# Patient Record
Sex: Male | Born: 1980 | Race: Black or African American | Hispanic: No | Marital: Single | Smoking: Never smoker
Health system: Southern US, Community
[De-identification: ages and names within clinical notes are randomized; demographics above are authoritative.]

## PROBLEM LIST (undated history)

## (undated) DIAGNOSIS — Z789 Other specified health status: Secondary | ICD-10-CM

---
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
## 2015-08-29 ENCOUNTER — Emergency Department (HOSPITAL_COMMUNITY): Payer: Self-pay

## 2015-08-29 ENCOUNTER — Encounter (HOSPITAL_COMMUNITY): Payer: Self-pay | Admitting: Emergency Medicine

## 2015-08-29 ENCOUNTER — Emergency Department (HOSPITAL_COMMUNITY)
Admission: EM | Admit: 2015-08-29 | Discharge: 2015-08-29 | Disposition: A | Discharge status: Home / Self Care | Payer: Self-pay | Attending: Emergency Medicine | Admitting: Emergency Medicine

## 2015-08-29 DIAGNOSIS — K439 Ventral hernia without obstruction or gangrene: Secondary | ICD-10-CM

## 2015-08-29 DIAGNOSIS — R109 Unspecified abdominal pain: Secondary | ICD-10-CM

## 2015-08-29 DIAGNOSIS — K469 Unspecified abdominal hernia without obstruction or gangrene: Secondary | ICD-10-CM | POA: Insufficient documentation

## 2015-08-29 LAB — COMPREHENSIVE METABOLIC PANEL
ALK PHOS: 94 U/L (ref 38–126)
ALT: 31 U/L (ref 17–63)
ANION GAP: 5 (ref 5–15)
AST: 23 U/L (ref 15–41)
Albumin: 4 g/dL (ref 3.5–5.0)
BILIRUBIN TOTAL: 0.7 mg/dL (ref 0.3–1.2)
BUN: 10 mg/dL (ref 6–20)
CALCIUM: 9.5 mg/dL (ref 8.9–10.3)
CO2: 30 mmol/L (ref 22–32)
Chloride: 105 mmol/L (ref 101–111)
Creatinine, Ser: 1.28 mg/dL — ABNORMAL HIGH (ref 0.61–1.24)
GFR calc Af Amer: >60 mL/min (ref >60)
Glucose, Bld: 127 mg/dL — ABNORMAL HIGH (ref 65–99)
POTASSIUM: 3.8 mmol/L (ref 3.5–5.1)
Sodium: 140 mmol/L (ref 135–145)
TOTAL PROTEIN: 7.7 g/dL (ref 6.5–8.1)

## 2015-08-29 LAB — CBC
HEMATOCRIT: 45 % (ref 39.0–52.0)
HEMOGLOBIN: 15.5 g/dL (ref 13.0–17.0)
MCH: 31.3 pg (ref 26.0–34.0)
MCHC: 34.4 g/dL (ref 30.0–36.0)
MCV: 90.7 fL (ref 78.0–100.0)
Platelets: 257 10*3/uL (ref 150–400)
RBC: 4.96 MIL/uL (ref 4.22–5.81)
RDW: 11.9 % (ref 11.5–15.5)
WBC: 7.2 10*3/uL (ref 4.0–10.5)

## 2015-08-29 LAB — URINALYSIS, ROUTINE W REFLEX MICROSCOPIC
BILIRUBIN URINE: NEGATIVE
Glucose, UA: NEGATIVE mg/dL
HGB URINE DIPSTICK: NEGATIVE
KETONES UR: NEGATIVE mg/dL
LEUKOCYTES UA: NEGATIVE
NITRITE: NEGATIVE
PH: 7 (ref 5.0–8.0)
Protein, ur: NEGATIVE mg/dL
Specific Gravity, Urine: 1.021 (ref 1.005–1.030)

## 2015-08-29 LAB — SAMPLE TO BLOOD BANK

## 2015-08-29 LAB — I-STAT CG4 LACTIC ACID, ED: Lactic Acid, Venous: 0.92 mmol/L (ref 0.5–2.0)

## 2015-08-29 LAB — LIPASE, BLOOD: Lipase: 47 U/L (ref 11–51)

## 2015-08-29 MED ORDER — HYDROCODONE-ACETAMINOPHEN 5-325 MG PO TABS: 2.0000 | ORAL_TABLET | ORAL | Status: DC | PRN

## 2015-08-29 MED ORDER — OXYCODONE-ACETAMINOPHEN 5-325 MG PO TABS: 2.0000 | ORAL_TABLET | ORAL | Status: DC | PRN

## 2015-08-29 MED ORDER — OXYCODONE-ACETAMINOPHEN 5-325 MG PO TABS
1.0000 | ORAL_TABLET | Freq: Once | ORAL | Status: AC
  Administered 2015-08-29: 1 via ORAL

## 2015-08-29 MED ORDER — IOHEXOL 300 MG/ML  SOLN
100.0000 mL | Freq: Once | INTRAMUSCULAR | Status: AC | PRN
  Administered 2015-08-29: 50 mL via INTRAVENOUS

## 2015-08-29 MED ORDER — OXYCODONE-ACETAMINOPHEN 5-325 MG PO TABS
ORAL_TABLET | ORAL | Status: AC
  Filled 2015-08-29: qty 1

## 2015-08-29 NOTE — Discharge Instructions (Signed)
Mr. Durenda GuthrieWilder,  Nice meeting you! Please follow-up with surgery (information attached). Return to the emergency department if you develop fevers, are unable to use the bathroom, or have increased pain. Your pain medication could constipate you, so please take a stool softener in addition to the pain medication. Feel better soon!  S. Lane HackerNicole Kadien Lineman, PA-C

## 2015-08-29 NOTE — ED Notes (Signed)
Pt tolerating PO intake

## 2015-08-29 NOTE — ED Provider Notes (Signed)
CSN: [id number]     Arrival date & time 08/29/15  1305 History   First MD Initiated Contact with Patient 08/29/15 1525     Chief Complaint  Patient presents with  . Hernia    HPI   Alexander Woodward is a 34 y.o. M PMH significant for HTN presenting with painful hernia since today. He states he was lifting something heavy in a grocery store when he developed abdominal pain. He describes his pain as 8/10 pain scale, non-radiating, constant, sharp. No fevers, chills, N/V, change in bowel/bladder habits.   History reviewed. No pertinent past medical history. History reviewed. No pertinent past surgical history. No family history on file. Social History  Substance Use Topics  . Smoking status: Never Smoker   . Smokeless tobacco: None  . Alcohol Use: No    Review of Systems  Ten systems are reviewed and are negative for acute change except as noted in the HPI   Allergies  Review of patient's allergies indicates no known allergies.  Home Medications   Prior to Admission medications   Medication Sig Start Date End Date Taking? Authorizing Provider  HYDROcodone-acetaminophen (NORCO/VICODIN) 5-325 MG tablet Take 2 tablets by mouth every 4 (four) hours as needed. 08/29/15   Melton Krebs, PA-C   BP 144/98 mmHg  Pulse 77  Temp(Src) 98 F (36.7 C) (Oral)  Resp 16  Ht  (1.676 m)  Wt 85.73 kg  BMI 30.52 kg/m2  SpO2 99% Physical Exam  Constitutional: He appears well-developed and well-nourished. No distress.  HENT:  Head: Normocephalic and atraumatic.  Mouth/Throat: Oropharynx is clear and moist. No oropharyngeal exudate.  Eyes: Conjunctivae are normal. Pupils are equal, round, and reactive to light. Right eye exhibits no discharge. Left eye exhibits no discharge. No scleral icterus.  Neck: No tracheal deviation present.  Cardiovascular: Normal rate, regular rhythm, normal heart sounds and intact distal pulses.  Exam reveals no gallop and no friction rub.   No murmur  heard. Pulmonary/Chest: Effort normal and breath sounds normal. No respiratory distress. He has no wheezes. He has no rales. He exhibits no tenderness.  Abdominal: Soft. Bowel sounds are normal. He exhibits no distension and no mass. There is tenderness. There is no rebound and no guarding.  Supraumbilical non-reducible hernia with associated tenderness. No erythema, drainage.   Musculoskeletal: He exhibits no edema.  Lymphadenopathy:    He has no cervical adenopathy.  Neurological: He is alert. Coordination normal.  Skin: Skin is warm and dry. No rash noted. He is not diaphoretic. No erythema.  Psychiatric: He has a normal mood and affect. His behavior is normal.  Nursing note and vitals reviewed.   ED Course  Procedures  Labs Review Labs Reviewed  COMPREHENSIVE METABOLIC PANEL - Abnormal; Notable for the following:    Glucose, Bld 127 (*)    Creatinine, Ser 1.28 (*)    All other components within normal limits  URINALYSIS, ROUTINE W REFLEX MICROSCOPIC (NOT AT [facility]) - Abnormal; Notable for the following:    APPearance CLOUDY (*)    All other components within normal limits  LIPASE, BLOOD  CBC  I-STAT CG4 LACTIC ACID, ED  SAMPLE TO BLOOD BANK    Imaging Review Ct Abdomen Pelvis W Contrast  08/29/2015  CLINICAL DATA:  Mid abdominal pain.  Right-sided pain. EXAM: CT ABDOMEN AND PELVIS WITH CONTRAST TECHNIQUE: Multidetector CT imaging of the abdomen and pelvis was performed using the standard protocol following bolus administration of intravenous contrast. CONTRAST:  50mL OMNIPAQUE  IOHEXOL 300 MG/ML  SOLN COMPARISON:  None. FINDINGS: Lower chest: There is no focal lung opacity. No pleural effusion identified. Hepatobiliary: No suspicious liver abnormalities identified. Stones noted within the gallbladder. This measures up to 1.3 cm. There is no biliary dilatation. Pancreas: Negative Spleen: Negative Adrenals/Urinary Tract: The adrenal glands are negative. Normal appearance of the left  kidney. Scar involves the upper pole of right kidney. Urinary bladder is normal. Stomach/Bowel: The stomach appears normal. The small bowel loops have a normal caliber. There is no pathologic dilatation of the small or large bowel loops. The appendix is visualized and appears normal. Vascular/Lymphatic: Normal appearance of the abdominal aorta. No enlarged retroperitoneal or mesenteric adenopathy. No enlarged pelvic or inguinal lymph nodes. Reproductive: The prostate gland and seminal vesicles appear within normal limits. Other: There is no ascites or focal fluid collections within the abdomen or pelvis. There is a supraumbilical hernia which contains fat and a nonobstructed loop of small bowel. There is mild stranding is around the hernia, image 44 of series 2. There is a small calcific density associated with the hernia measure 6 mm, image 45 of series 2. There is also a small periumbilical hernia which contains fat only. Musculoskeletal: No aggressive lytic or sclerotic bone lesions identified. IMPRESSION: 1. There is a supraumbilical ventral abdominal wall hernia containing fat and a nonobstructed loop of small bowel. There is also a 6 mm calcified thick density within this area which is of on certain significance. There is subtle stranding of the surrounding fat. In a patient who is acutely symptomatic in this area early incarceration cannot be excluded. Careful clinical correlation is recommended. 2. Small fat containing umbilical hernia. 3. Gallstones. Electronically Signed   By: Signa Kellaylor  Stroud M.D.   On: 08/29/2015 18:06   Dg Abd Acute W/chest  08/29/2015  CLINICAL DATA:  Chronic abdominal pain EXAM: DG ABDOMEN ACUTE W/ 1V CHEST COMPARISON:  None. FINDINGS: Normal heart size and vascularity. Lungs remain clear. No focal pneumonia, collapse or consolidation. No edema, effusion or pneumothorax. Trachea midline. No free air evident. Scattered air and stool throughout the bowel. No significant dilatation,  obstruction or ileus pattern. No abnormal calcifications or osseous abnormality. IMPRESSION: No acute finding by plain radiography. Electronically Signed   By: Judie PetitM.  Shick M.D.   On: 08/29/2015 17:29   I have personally reviewed and evaluated these images and lab results as part of my medical decision-making.  MDM   Final diagnoses:  Abdominal pain  Hernia of abdominal wall   Patient non-toxic appearing and VSS. Attempted reduction s/p Trendelenburg and ice without success. Although this appears to be a chronic issue, with sudden onset of abdominal pain, this is concerning for strangulation, so will CT.  Labs unremarkable except for slightly elevated creatinine. CT demonstrates supraumbilical ventral abdominal wall hernia containing fat and a nonobstructed loop of small bowel, and possible incarceration in a symptomatic patient.  Dr. Cyndie ChimeNguyen saw patient as well.  She will consult general surgery. General surgery advised outpatient follow-up.  Patient may be safely discharged home. Discussed reasons for return. Patient to follow-up with surgery. Patient in understanding and agreement with the plan.   Melton KrebsSamantha Nicole Sheneika Walstad, PA-C 09/13/15 1405  Leta BaptistEmily Roe Nguyen, MD 09/14/15 [phone]

## 2015-08-29 NOTE — ED Notes (Signed)
Pt states he has had a hernia in his middle abdomen for a few years and this morning while lifting something in the grocery store he began to have pain in the area where the hernia is. Pt states 8/10 abd pain at this time. Denies any n/v/d. Normal bowel movement this AM.

## 2016-06-05 NOTE — Patient Instructions (Addendum)
Alexander Woodward  06/05/2016   Your procedure is scheduled on: 06/14/15  Report to [facility]  [place] to 3rd floor to  Short Stay Center at 0945 AM.  Call this number if you have problems the morning of surgery [phone]   Remember: ONLY 1 PERSON MAY GO WITH YOU TO SHORT STAY TO GET  READY MORNING OF YOUR SURGERY.  Do not eat food or drink liquids :After Midnight.     Take these medicines the morning of surgery with A SIP OF WATER: NONE DO NOT TAKE ANY DIABETIC MEDICATIONS DAY OF YOUR SURGERY                               You may not have any metal on your body including hair pins and              piercings  Do not wear jewelry, make-up, lotions, powders or perfumes, deodorant             Do not wear nail polish.  Do not shave  48 hours prior to surgery.              Men may shave face and neck.   Do not bring valuables to the hospital. [facility] IS NOT             RESPONSIBLE   FOR VALUABLES.  Contacts, dentures or bridgework may not be worn into surgery.  Leave suitcase in the car. After surgery it may be brought to your room.   FAMILY WILL DRIVE HIM HOME MORNING AFTER SURGERY_____________________________________________________________________             [facility] - Preparing for [facility] Before surgery, you can play an important role.  Because skin is not sterile, your skin needs to be as free of germs as possible.  You can reduce the number of germs on your skin by washing with CHG (chlorahexidine gluconate) soap before surgery.  CHG is an antiseptic cleaner which kills germs and bonds with the skin to continue killing germs even after washing. Please DO NOT use if you have an allergy to CHG or antibacterial soaps.  If your skin becomes reddened/irritated stop using the CHG and inform your nurse when you arrive at Short Stay. Do not shave (including legs and underarms) for at least 48 hours prior to the first CHG shower.   You may shave your face/neck. Please follow these instructions carefully:  1.  Shower with CHG Soap the night before surgery and the  morning of Surgery.  2.  If you choose to wash your hair, wash your hair first as usual with your  normal  shampoo.  3.  After you shampoo, rinse your hair and body thoroughly to remove the  shampoo.                           4.  Use CHG as you would any other liquid soap.  You can apply chg directly  to the skin and wash                       Gently with a scrungie or clean washcloth.  5.  Apply the CHG Soap to your body ONLY FROM THE NECK DOWN.  Do not use on face/ open                           Wound or open sores. Avoid contact with eyes, ears mouth and genitals (private parts).                       Wash face,  Genitals (private parts) with your normal soap.             6.  Wash thoroughly, paying special attention to the area where your surgery  will be performed.  7.  Thoroughly rinse your body with warm water from the neck down.  8.  DO NOT shower/wash with your normal soap after using and rinsing off  the CHG Soap.                9.  Pat yourself dry with a clean towel.            10.  Wear clean pajamas.            11.  Place clean sheets on your bed the night of your first shower and do not  sleep with pets. Day of Surgery : Do not apply any lotions/deodorants the morning of surgery.  Please wear clean clothes to the hospital/surgery center.  FAILURE TO FOLLOW THESE INSTRUCTIONS MAY RESULT IN THE CANCELLATION OF YOUR SURGERY PATIENT SIGNATURE_________________________________  NURSE SIGNATURE__________________________________  ________________________________________________________________________

## 2016-06-05 NOTE — Progress Notes (Signed)
Ct chest 11/16 epic

## 2016-06-07 ENCOUNTER — Encounter (HOSPITAL_COMMUNITY)
Admission: RE | Admit: 2016-06-07 | Discharge: 2016-06-07 | Disposition: A | Discharge status: Home / Self Care | Payer: Self-pay | Source: Ambulatory Visit | Attending: General Surgery | Admitting: General Surgery

## 2016-06-07 ENCOUNTER — Encounter (HOSPITAL_COMMUNITY): Payer: Self-pay

## 2016-06-07 DIAGNOSIS — Z01812 Encounter for preprocedural laboratory examination: Secondary | ICD-10-CM | POA: Insufficient documentation

## 2016-06-07 DIAGNOSIS — Z789 Other specified health status: Secondary | ICD-10-CM

## 2016-06-07 DIAGNOSIS — Z01810 Encounter for preprocedural cardiovascular examination: Secondary | ICD-10-CM | POA: Insufficient documentation

## 2016-06-07 HISTORY — DX: Other specified health status: Z78.9

## 2016-06-07 LAB — CBC
HEMATOCRIT: 44.2 % (ref 39.0–52.0)
HEMOGLOBIN: 15.2 g/dL (ref 13.0–17.0)
MCH: 30.9 pg (ref 26.0–34.0)
MCHC: 34.4 g/dL (ref 30.0–36.0)
MCV: 89.8 fL (ref 78.0–100.0)
Platelets: 267 10*3/uL (ref 150–400)
RBC: 4.92 MIL/uL (ref 4.22–5.81)
RDW: 11.8 % (ref 11.5–15.5)
WBC: 5.1 10*3/uL (ref 4.0–10.5)

## 2016-06-07 NOTE — Progress Notes (Signed)
Please place surgical orders in epic. Thanks.  

## 2016-06-07 NOTE — Progress Notes (Signed)
PST NOTE-- pt states had surgery on his heart as a baby- he thinks.  Confirmed this with his mother via phone- she states he was dying and he went by [place] to [place] where the put a catheter around his heart.  At this visit, patint denied any medical problems and does not have a PCP.  He denies any headache, weakness, but BP remained elevated.  I instructed him that he needs to go to a Urgent Care today to be seen by MD and have this evaluated. Stated didn't want him to have a stroke, nor his surgery be cancelled because his BP is high.  Did obtain hospital name from mother, had consent signed for record request, and faxed request to obtain at least thype of surgery he had as a infant

## 2016-06-11 NOTE — Progress Notes (Signed)
Dr Jarrett SohoWilson---NEED PRE OP ORDERS PLEASE

## 2016-06-11 NOTE — Progress Notes (Signed)
Attempted to [facility] on Friday-  Fax [phone] without a confirmation received. Requesting discharge summary from his care as a infant.  Faxed again at 0745 this am with no success.  I Called to verify fax number and was told that records that old have been destroyed!

## 2016-06-12 ENCOUNTER — Ambulatory Visit: Payer: Self-pay | Admitting: General Surgery

## 2016-06-12 NOTE — H&P (Signed)
Alexander Woodward is an 35 y.o. male.   Chief Complaint: here for surgery HPI: The patient is a 35 year old male who presents with an abdominal wall hernia. He is referred by Delana Meyer, PA-C for evaluation of abdominal wall hernia. He states that it has been present for years. However it is gotten bigger lately. This now causing some more discomfort. At times it'll be hard. He does have some intermittent nausea but no vomiting. He denies any diarrhea or constipation. He denies any prior abdominal surgery. He had a right inguinal hernia repair as an infant he says. He does not smoking. He had a CT scan in November 2016 which showed supraumbilical hernia containing fat and a nonobstructed loop of small bowel. He also had a very small umbilical hernia. On CT his hernia measures 3.7 cm wide by approximate 3.5 cm vertical. There is intact bridge of fascia measuring about 1-1/2 cm between the hernia and the umbilicus. The fascial defect of the umbilicus is very small perhaps 4 mm  He denies any changes since he was seen in the office. It did bother him the other day and he took some tylenol.  Past Medical History:  Diagnosis Date  . Medical history non-contributory 06/07/2016   denies    Past Surgical History:  Procedure Laterality Date  . CARDIAC SURGERY  1980   as a newborn    No family history on file. Social History:  reports that he has never smoked. He has never used smokeless tobacco. He reports that he does not drink alcohol or use drugs.  Allergies: No Known Allergies   (Not in a hospital admission)  No results found for this or any previous visit (from the past 48 hour(s)). No results found.  ROS 12 point ROS performed and all negative except for what is mentioned in HPI  There were no vitals taken for this visit.  There were no vitals taken for this visit. Physical Exam  General Mental Status-Alert. General Appearance-Consistent with stated  age. Hydration-Well hydrated. Voice-Normal.  Head and Neck Head-normocephalic, atraumatic with no lesions or palpable masses. Trachea-midline. Thyroid Gland Characteristics - normal size and consistency.  Eye Eyeball - Bilateral-Extraocular movements intact. Sclera/Conjunctiva - Bilateral-No scleral icterus.  Chest and Lung Exam Chest and lung exam reveals -quiet, even and easy respiratory effort with no use of accessory muscles and on auscultation, normal breath sounds, no adventitious sounds and normal vocal resonance. Inspection Chest Wall - Normal. Back - normal.  Breast - Did not examine.  Cardiovascular Cardiovascular examination reveals -normal heart sounds, regular rate and rhythm with no murmurs and normal pedal pulses bilaterally.  Abdomen Inspection  Inspection of the abdomen reveals: Note: Obvious supraumbilical bulge. It is nonreducible. It is nontender. Skin intact. Bulges the size of a tennis ball. Small tiny fascial umbilical defect at the umbilicus less than half a centimeter. Skin - Scar - no surgical scars. Palpation/Percussion Palpation and Percussion of the abdomen reveal - Soft, Non Tender, No Rebound tenderness, No Rigidity (guarding) and No hepatosplenomegaly. Auscultation Auscultation of the abdomen reveals - Bowel sounds normal.  Peripheral Vascular Upper Extremity Palpation - Pulses bilaterally normal.  Neurologic Neurologic evaluation reveals -alert and oriented x 3 with no impairment of recent or remote memory. Mental Status-Normal.  Neuropsychiatric The patient's mood and affect are described as -normal. Judgment and Insight-insight is appropriate concerning matters relevant to self.  Musculoskeletal Normal Exam - Left-Upper Extremity Strength Normal and Lower Extremity Strength Normal. Normal Exam - Right-Upper  Extremity Strength Normal and Lower Extremity Strength Normal.  Lymphatic Head & Neck  General  Head & Neck Lymphatics: Bilateral - Description - Normal. Axillary - Did not examine. Femoral & Inguinal - Did not examine. Assessment/Plan Incarcerated ventral hernia  To OR for lap assisted repair with mesh No new questions.  All questions asked and answered.  Discussed postop pain regimen  Mary SellaEric M. Andrey CampanileWilson, MD, FACS General, Bariatric, & Minimally Invasive Surgery [facility], [place]   Atilano InaWILSON,Kamryn Gauthier M, MD 06/13/2016, 10:48 AM

## 2016-06-12 NOTE — Progress Notes (Signed)
Called [facility] triage desk about obtaining orders for surgery scheduled for 06/13/16

## 2016-06-13 ENCOUNTER — Observation Stay (HOSPITAL_COMMUNITY)
Admission: RE | Admit: 2016-06-13 | Discharge: 2016-06-14 | Disposition: A | Discharge status: Home / Self Care | Payer: Self-pay | Source: Ambulatory Visit | Attending: General Surgery | Admitting: General Surgery

## 2016-06-13 ENCOUNTER — Encounter (HOSPITAL_COMMUNITY)
Admission: RE | Disposition: A | Discharge status: Home / Self Care | Payer: Self-pay | Source: Ambulatory Visit | Attending: General Surgery

## 2016-06-13 ENCOUNTER — Encounter (HOSPITAL_COMMUNITY): Payer: Self-pay

## 2016-06-13 ENCOUNTER — Ambulatory Visit (HOSPITAL_COMMUNITY): Payer: Self-pay | Admitting: Anesthesiology

## 2016-06-13 DIAGNOSIS — K429 Umbilical hernia without obstruction or gangrene: Secondary | ICD-10-CM | POA: Insufficient documentation

## 2016-06-13 DIAGNOSIS — Z9889 Other specified postprocedural states: Secondary | ICD-10-CM

## 2016-06-13 DIAGNOSIS — Z8719 Personal history of other diseases of the digestive system: Secondary | ICD-10-CM

## 2016-06-13 DIAGNOSIS — K439 Ventral hernia without obstruction or gangrene: Principal | ICD-10-CM | POA: Insufficient documentation

## 2016-06-13 HISTORY — PX: INSERTION OF MESH: SHX5868

## 2016-06-13 HISTORY — PX: LAPAROSCOPIC ASSISTED VENTRAL HERNIA REPAIR: SHX6312

## 2016-06-13 SURGERY — REPAIR, HERNIA, VENTRAL, LAPAROSCOPY-ASSISTED
Anesthesia: General | Site: Abdomen

## 2016-06-13 MED ORDER — MORPHINE SULFATE (PF) 2 MG/ML IV SOLN
1.0000 mg | INTRAVENOUS | Status: DC | PRN
  Filled 2016-06-13: qty 1

## 2016-06-13 MED ORDER — ENOXAPARIN SODIUM 40 MG/0.4ML ~~LOC~~ SOLN
40.0000 mg | SUBCUTANEOUS | Status: DC
  Administered 2016-06-14: 40 mg via SUBCUTANEOUS
  Filled 2016-06-13: qty 0.4

## 2016-06-13 MED ORDER — LACTATED RINGERS IV SOLN
INTRAVENOUS | Status: DC
  Administered 2016-06-13 (×2): via INTRAVENOUS

## 2016-06-13 MED ORDER — MIDAZOLAM HCL 2 MG/2ML IJ SOLN
INTRAMUSCULAR | Status: AC
  Filled 2016-06-13: qty 2

## 2016-06-13 MED ORDER — ACETAMINOPHEN 500 MG PO TABS
1000.0000 mg | ORAL_TABLET | Freq: Four times a day (QID) | ORAL | Status: DC
  Administered 2016-06-14 (×2): 1000 mg via ORAL
  Filled 2016-06-13 (×3): qty 2

## 2016-06-13 MED ORDER — METHOCARBAMOL 500 MG PO TABS: 500.0000 mg | ORAL_TABLET | Freq: Four times a day (QID) | ORAL | Status: DC | PRN

## 2016-06-13 MED ORDER — DIPHENHYDRAMINE HCL 12.5 MG/5ML PO ELIX: 12.5000 mg | ORAL_SOLUTION | Freq: Four times a day (QID) | ORAL | Status: DC | PRN

## 2016-06-13 MED ORDER — ONDANSETRON HCL 4 MG/2ML IJ SOLN
INTRAMUSCULAR | Status: DC | PRN
  Administered 2016-06-13: 4 mg via INTRAVENOUS

## 2016-06-13 MED ORDER — CHLORHEXIDINE GLUCONATE CLOTH 2 % EX PADS: 6.0000 | MEDICATED_PAD | Freq: Once | CUTANEOUS | Status: DC

## 2016-06-13 MED ORDER — SODIUM CHLORIDE 0.9 % IJ SOLN
INTRAMUSCULAR | Status: AC
  Filled 2016-06-13: qty 50

## 2016-06-13 MED ORDER — SUGAMMADEX SODIUM 200 MG/2ML IV SOLN
INTRAVENOUS | Status: DC | PRN
  Administered 2016-06-13: 175 mg via INTRAVENOUS

## 2016-06-13 MED ORDER — FENTANYL CITRATE (PF) 100 MCG/2ML IJ SOLN
INTRAMUSCULAR | Status: AC
Start: 2016-06-13 — End: 2016-06-13
  Filled 2016-06-13: qty 2

## 2016-06-13 MED ORDER — PROPOFOL 10 MG/ML IV BOLUS
INTRAVENOUS | Status: DC | PRN
  Administered 2016-06-13: 180 mg via INTRAVENOUS

## 2016-06-13 MED ORDER — KCL IN DEXTROSE-NACL 20-5-0.45 MEQ/L-%-% IV SOLN
INTRAVENOUS | Status: DC
  Administered 2016-06-13: 18:00:00 via INTRAVENOUS
  Filled 2016-06-13 (×2): qty 1000

## 2016-06-13 MED ORDER — ACETAMINOPHEN 500 MG PO TABS
1000.0000 mg | ORAL_TABLET | ORAL | Status: AC
  Administered 2016-06-13: 1000 mg via ORAL
  Filled 2016-06-13: qty 2

## 2016-06-13 MED ORDER — CEFAZOLIN SODIUM-DEXTROSE 2-4 GM/100ML-% IV SOLN
INTRAVENOUS | Status: AC
  Filled 2016-06-13: qty 100

## 2016-06-13 MED ORDER — FENTANYL CITRATE (PF) 100 MCG/2ML IJ SOLN
INTRAMUSCULAR | Status: AC
  Filled 2016-06-13: qty 2

## 2016-06-13 MED ORDER — ONDANSETRON HCL 4 MG/2ML IJ SOLN
4.0000 mg | Freq: Four times a day (QID) | INTRAMUSCULAR | Status: DC | PRN
  Administered 2016-06-13: 4 mg via INTRAVENOUS
  Filled 2016-06-13: qty 2

## 2016-06-13 MED ORDER — ROCURONIUM BROMIDE 10 MG/ML (PF) SYRINGE
PREFILLED_SYRINGE | INTRAVENOUS | Status: DC | PRN
  Administered 2016-06-13 (×3): 5 mg via INTRAVENOUS
  Administered 2016-06-13: 50 mg via INTRAVENOUS

## 2016-06-13 MED ORDER — SUGAMMADEX SODIUM 200 MG/2ML IV SOLN
INTRAVENOUS | Status: AC
  Filled 2016-06-13: qty 2

## 2016-06-13 MED ORDER — CEFAZOLIN SODIUM-DEXTROSE 2-4 GM/100ML-% IV SOLN
2.0000 g | INTRAVENOUS | Status: AC
  Administered 2016-06-13: 2 g via INTRAVENOUS

## 2016-06-13 MED ORDER — MIDAZOLAM HCL 5 MG/5ML IJ SOLN
INTRAMUSCULAR | Status: DC | PRN
  Administered 2016-06-13: 2 mg via INTRAVENOUS

## 2016-06-13 MED ORDER — PROMETHAZINE HCL 25 MG/ML IJ SOLN: 6.2500 mg | INTRAMUSCULAR | Status: DC | PRN

## 2016-06-13 MED ORDER — KETOROLAC TROMETHAMINE 30 MG/ML IJ SOLN
INTRAMUSCULAR | Status: AC
  Filled 2016-06-13: qty 1

## 2016-06-13 MED ORDER — DIPHENHYDRAMINE HCL 50 MG/ML IJ SOLN: 12.5000 mg | Freq: Four times a day (QID) | INTRAMUSCULAR | Status: DC | PRN

## 2016-06-13 MED ORDER — HYDROMORPHONE HCL 1 MG/ML IJ SOLN
INTRAMUSCULAR | Status: DC | PRN
  Administered 2016-06-13 (×2): 1 mg via INTRAVENOUS

## 2016-06-13 MED ORDER — HYDROMORPHONE HCL 2 MG/ML IJ SOLN
INTRAMUSCULAR | Status: AC
  Filled 2016-06-13: qty 1

## 2016-06-13 MED ORDER — 0.9 % SODIUM CHLORIDE (POUR BTL) OPTIME
TOPICAL | Status: DC | PRN
  Administered 2016-06-13: 1000 mL

## 2016-06-13 MED ORDER — FAMOTIDINE IN NACL 20-0.9 MG/50ML-% IV SOLN
20.0000 mg | Freq: Two times a day (BID) | INTRAVENOUS | Status: DC
  Administered 2016-06-13: 20 mg via INTRAVENOUS
  Filled 2016-06-13: qty 50

## 2016-06-13 MED ORDER — LIDOCAINE 2% (20 MG/ML) 5 ML SYRINGE
INTRAMUSCULAR | Status: DC | PRN
  Administered 2016-06-13: 100 mg via INTRAVENOUS

## 2016-06-13 MED ORDER — KETOROLAC TROMETHAMINE 30 MG/ML IJ SOLN
INTRAMUSCULAR | Status: DC | PRN
  Administered 2016-06-13: 30 mg via INTRAVENOUS

## 2016-06-13 MED ORDER — DOCUSATE SODIUM 100 MG PO CAPS
100.0000 mg | ORAL_CAPSULE | Freq: Two times a day (BID) | ORAL | Status: DC
  Administered 2016-06-13: 100 mg via ORAL
  Filled 2016-06-13: qty 1

## 2016-06-13 MED ORDER — DEXAMETHASONE SODIUM PHOSPHATE 10 MG/ML IJ SOLN
INTRAMUSCULAR | Status: DC | PRN
  Administered 2016-06-13: 10 mg via INTRAVENOUS

## 2016-06-13 MED ORDER — PROPOFOL 10 MG/ML IV BOLUS
INTRAVENOUS | Status: AC
  Filled 2016-06-13: qty 20

## 2016-06-13 MED ORDER — BUPIVACAINE LIPOSOME 1.3 % IJ SUSP
20.0000 mL | Freq: Once | INTRAMUSCULAR | Status: DC
  Filled 2016-06-13: qty 20

## 2016-06-13 MED ORDER — FENTANYL CITRATE (PF) 100 MCG/2ML IJ SOLN
INTRAMUSCULAR | Status: DC | PRN
  Administered 2016-06-13: 50 ug via INTRAVENOUS
  Administered 2016-06-13: 100 ug via INTRAVENOUS

## 2016-06-13 MED ORDER — OXYCODONE HCL 5 MG PO TABS: 5.0000 mg | ORAL_TABLET | ORAL | Status: DC | PRN

## 2016-06-13 MED ORDER — HYDROMORPHONE HCL 1 MG/ML IJ SOLN: 0.2500 mg | INTRAMUSCULAR | Status: DC | PRN

## 2016-06-13 MED ORDER — ONDANSETRON 4 MG PO TBDP: 4.0000 mg | ORAL_TABLET | Freq: Four times a day (QID) | ORAL | Status: DC | PRN

## 2016-06-13 MED ORDER — ZOLPIDEM TARTRATE 5 MG PO TABS: 5.0000 mg | ORAL_TABLET | Freq: Every evening | ORAL | Status: DC | PRN

## 2016-06-13 MED ORDER — KETOROLAC TROMETHAMINE 30 MG/ML IJ SOLN
30.0000 mg | Freq: Four times a day (QID) | INTRAMUSCULAR | Status: DC
  Administered 2016-06-13 – 2016-06-14 (×3): 30 mg via INTRAVENOUS
  Filled 2016-06-13 (×3): qty 1

## 2016-06-13 MED ORDER — GABAPENTIN 300 MG PO CAPS
300.0000 mg | ORAL_CAPSULE | ORAL | Status: AC
  Administered 2016-06-13: 300 mg via ORAL
  Filled 2016-06-13: qty 1

## 2016-06-13 SURGICAL SUPPLY — 45 items
APPLIER CLIP LOGIC TI 5 (MISCELLANEOUS) IMPLANT
BENZOIN TINCTURE PRP APPL 2/3 (GAUZE/BANDAGES/DRESSINGS) ×3 IMPLANT
BINDER ABDOMINAL 12 ML 46-62 (SOFTGOODS) IMPLANT
CABLE HIGH FREQUENCY MONO STRZ (ELECTRODE) ×3 IMPLANT
CHLORAPREP W/TINT 26ML (MISCELLANEOUS) ×3 IMPLANT
COVER SURGICAL LIGHT HANDLE (MISCELLANEOUS) IMPLANT
DECANTER SPIKE VIAL GLASS SM (MISCELLANEOUS) ×3 IMPLANT
DERMABOND ADVANCED (GAUZE/BANDAGES/DRESSINGS)
DERMABOND ADVANCED .7 DNX12 (GAUZE/BANDAGES/DRESSINGS) IMPLANT
DEVICE SECURE STRAP 25 ABSORB (INSTRUMENTS) IMPLANT
DEVICE TROCAR PUNCTURE CLOSURE (ENDOMECHANICALS) IMPLANT
DRAPE INCISE IOBAN 66X45 STRL (DRAPES) IMPLANT
DRAPE UTILITY XL STRL (DRAPES) IMPLANT
DRSG OPSITE POSTOP 4X6 (GAUZE/BANDAGES/DRESSINGS) ×3 IMPLANT
DRSG TEGADERM 2-3/8X2-3/4 SM (GAUZE/BANDAGES/DRESSINGS) ×3 IMPLANT
ELECT PENCIL ROCKER SW 15FT (MISCELLANEOUS) ×3 IMPLANT
ELECT REM PT RETURN 9FT ADLT (ELECTROSURGICAL) ×3
ELECTRODE REM PT RTRN 9FT ADLT (ELECTROSURGICAL) ×1 IMPLANT
GAUZE SPONGE 2X2 8PLY STRL LF (GAUZE/BANDAGES/DRESSINGS) ×1 IMPLANT
GOWN STRL REUS W/TWL XL LVL3 (GOWN DISPOSABLE) ×12 IMPLANT
GRASPER SUT TROCAR 14GX15 (MISCELLANEOUS) ×3 IMPLANT
IRRIG SUCT STRYKERFLOW 2 WTIP (MISCELLANEOUS)
IRRIGATION SUCT STRKRFLW 2 WTP (MISCELLANEOUS) IMPLANT
KIT BASIN OR (CUSTOM PROCEDURE TRAY) ×3 IMPLANT
L-HOOK LAP DISP 36CM (ELECTROSURGICAL) ×3
LHOOK LAP DISP 36CM (ELECTROSURGICAL) ×1 IMPLANT
MARKER SKIN DUAL TIP RULER LAB (MISCELLANEOUS) ×3 IMPLANT
MESH VENTRALIGHT ST 6IN CRC (Mesh General) ×3 IMPLANT
NEEDLE SPNL 22GX3.5 QUINCKE BK (NEEDLE) ×3 IMPLANT
SCISSORS LAP 5X35 DISP (ENDOMECHANICALS) ×3 IMPLANT
SHEARS HARMONIC ACE PLUS 36CM (ENDOMECHANICALS) IMPLANT
SLEEVE XCEL OPT CAN 5 100 (ENDOMECHANICALS) ×3 IMPLANT
SPONGE GAUZE 2X2 STER 10/PKG (GAUZE/BANDAGES/DRESSINGS) ×2
SUT MNCRL AB 4-0 PS2 18 (SUTURE) ×3 IMPLANT
SUT NOVA NAB GS-21 0 18 T12 DT (SUTURE) ×6 IMPLANT
SUT VIC AB 3-0 SH 18 (SUTURE) ×3 IMPLANT
TOWEL OR 17X26 10 PK STRL BLUE (TOWEL DISPOSABLE) ×3 IMPLANT
TOWEL OR NON WOVEN STRL DISP B (DISPOSABLE) IMPLANT
TRAY FOLEY W/METER SILVER 14FR (SET/KITS/TRAYS/PACK) IMPLANT
TRAY FOLEY W/METER SILVER 16FR (SET/KITS/TRAYS/PACK) ×3 IMPLANT
TRAY LAPAROSCOPIC (CUSTOM PROCEDURE TRAY) ×3 IMPLANT
TROCAR BLADELESS OPT 5 100 (ENDOMECHANICALS) ×3 IMPLANT
TROCAR XCEL BLUNT TIP 100MML (ENDOMECHANICALS) IMPLANT
TROCAR XCEL NON-BLD 11X100MML (ENDOMECHANICALS) IMPLANT
TUBING INSUF HEATED (TUBING) ×3 IMPLANT

## 2016-06-13 NOTE — Anesthesia Postprocedure Evaluation (Signed)
Anesthesia Post Note  Patient: Alexander Woodward  Procedure(s) Performed: Procedure(s) (LRB): LAPAROSCOPIC ASSISTED INCARCERATED VENTRAL HERNIA REPAIR WITH MESH (N/A) INSERTION OF MESH (N/A)  Patient location during evaluation: PACU Anesthesia Type: General Level of consciousness: awake and alert Pain management: pain level controlled Vital Signs Assessment: post-procedure vital signs reviewed and stable Respiratory status: spontaneous breathing, nonlabored ventilation, respiratory function stable and patient connected to nasal cannula oxygen Cardiovascular status: blood pressure returned to baseline and stable Postop Assessment: no signs of nausea or vomiting Anesthetic complications: no    Last Vitals:  Vitals:   06/13/16 0949 06/13/16 1352  BP: (!) 169/92 (!) 161/95  Pulse: 97 83  Resp: 16 20  Temp: 36.9 C 36.4 C    Last Pain:  Vitals:   06/13/16 1352  TempSrc:   PainSc: 0-No pain                 Reino KentJudd, Jezlyn Westerfield J

## 2016-06-13 NOTE — H&P (View-Only) (Signed)
Alexander Woodward is an 35 y.o. male.   Chief Complaint: here for surgery HPI: The patient is a 35 year old male who presents with an abdominal wall hernia. He is referred by Alexander Meyer, PA-C for evaluation of abdominal wall hernia. He states that it has been present for years. However it is gotten bigger lately. This now causing some more discomfort. At times it'll be hard. He does have some intermittent nausea but no vomiting. He denies any diarrhea or constipation. He denies any prior abdominal surgery. He had a right inguinal hernia repair as an infant he says. He does not smoking. He had a CT scan in November 2016 which showed supraumbilical hernia containing fat and a nonobstructed loop of small bowel. He also had a very small umbilical hernia. On CT his hernia measures 3.7 cm wide by approximate 3.5 cm vertical. There is intact bridge of fascia measuring about 1-1/2 cm between the hernia and the umbilicus. The fascial defect of the umbilicus is very small perhaps 4 mm  He denies any changes since he was seen in the office. It did bother him the other day and he took some tylenol.  Past Medical History:  Diagnosis Date  . Medical history non-contributory 06/07/2016   denies    Past Surgical History:  Procedure Laterality Date  . CARDIAC SURGERY  1980   as a newborn    No family history on file. Social History:  reports that he has never smoked. He has never used smokeless tobacco. He reports that he does not drink alcohol or use drugs.  Allergies: No Known Allergies   (Not in a hospital admission)  No results found for this or any previous visit (from the past 48 hour(s)). No results found.  ROS 12 point ROS performed and all negative except for what is mentioned in HPI  There were no vitals taken for this visit.  There were no vitals taken for this visit. Physical Exam  General Mental Status-Alert. General Appearance-Consistent with stated  age. Hydration-Well hydrated. Voice-Normal.  Head and Neck Head-normocephalic, atraumatic with no lesions or palpable masses. Trachea-midline. Thyroid Gland Characteristics - normal size and consistency.  Eye Eyeball - Bilateral-Extraocular movements intact. Sclera/Conjunctiva - Bilateral-No scleral icterus.  Chest and Lung Exam Chest and lung exam reveals -quiet, even and easy respiratory effort with no use of accessory muscles and on auscultation, normal breath sounds, no adventitious sounds and normal vocal resonance. Inspection Chest Wall - Normal. Back - normal.  Breast - Did not examine.  Cardiovascular Cardiovascular examination reveals -normal heart sounds, regular rate and rhythm with no murmurs and normal pedal pulses bilaterally.  Abdomen Inspection  Inspection of the abdomen reveals: Note: Obvious supraumbilical bulge. It is nonreducible. It is nontender. Skin intact. Bulges the size of a tennis ball. Small tiny fascial umbilical defect at the umbilicus less than half a centimeter. Skin - Scar - no surgical scars. Palpation/Percussion Palpation and Percussion of the abdomen reveal - Soft, Non Tender, No Rebound tenderness, No Rigidity (guarding) and No hepatosplenomegaly. Auscultation Auscultation of the abdomen reveals - Bowel sounds normal.  Peripheral Vascular Upper Extremity Palpation - Pulses bilaterally normal.  Neurologic Neurologic evaluation reveals -alert and oriented x 3 with no impairment of recent or remote memory. Mental Status-Normal.  Neuropsychiatric The patient's mood and affect are described as -normal. Judgment and Insight-insight is appropriate concerning matters relevant to self.  Musculoskeletal Normal Exam - Left-Upper Extremity Strength Normal and Lower Extremity Strength Normal. Normal Exam - Right-Upper  Extremity Strength Normal and Lower Extremity Strength Normal.  Lymphatic Head & Neck  General  Head & Neck Lymphatics: Bilateral - Description - Normal. Axillary - Did not examine. Femoral & Inguinal - Did not examine. Assessment/Plan Incarcerated ventral hernia  To OR for lap assisted repair with mesh No new questions.  All questions asked and answered.  Discussed postop pain regimen  Alexander SellaEric Woodward. Andrey CampanileWilson, MD, FACS General, Bariatric, & Minimally Invasive Surgery [facility] [place]   Alexander InaWILSON,Alexander Garrido M, MD 06/13/2016, 10:48 AM

## 2016-06-13 NOTE — Op Note (Signed)
Alexander Carolinaekema Pilson [id number] 04/27/1981 06/13/2016  Laparoscopic Assisted Repair of Ventral Hernia with Mesh Procedure Note  Indications: Symptomatic supraumbilical ventral hernia  Pre-operative Diagnosis: incarcerated supraumbilical ventral hernia  Post-operative Diagnosis: supraumbilical ventral hernia  Surgeon: Atilano InaWILSON,Kimbery Harwood M   Assistants: none  Anesthesia: General endotracheal anesthesia  Procedure Details  The patient was seen in the Holding Room. The risks, benefits, complications, treatment options, and expected outcomes were discussed with the patient. The possibilities of reaction to medication, pulmonary aspiration, perforation of viscus, bleeding, recurrent infection, the need for additional procedures, failure to diagnose a condition, and creating a complication requiring transfusion or operation were discussed with the patient. The patient concurred with the proposed plan, giving informed consent.  The site of surgery properly noted/marked. The patient was taken to the operating room, identified as Alexander Woodward and the procedure verified as laparoscopic ventral hernia repair with mesh. A Time Out was held and the above information confirmed.  The patient received 1000 mg of oral Tylenol as well as Neurontin preoperatively. He received IV antibiotics prior to skin incision.  The patient was placed supine.  After establishing general anesthesia, a Foley catheter was placed.  The abdomen was prepped with Chloraprep and draped in standard fashion.  A 5 mm Optiview was used the cannulate the peritoneal cavity in the left upper quadrant below the costal margin.  Pneumoperitoneum was obtained by insufflating CO2, maintaining a maximum pressure of 15 mmHg.  The 5 mm 30-degree laparoscopic was inserted.  There were no significant omental adhesions to the anterior abdominal wall in and around the hernia defect. The hernia defect was in the supraumbilical position at the takeoff of the falciform  ligament.  Another 5-mm port was placed in the left lower quadrant.  Endo Shears with electrocautery were used to take down the falciform ligament away from the anterior abdominal wall.  We cleared the entire abdominal wall and were able to visualize 1 fascial defect. At this point I switched to an open procedure because the goal was to reapproximate the fascia primarily with mesh underlay.  A vertical upper midline incision was made directly over the hernia defect with a 15 blade. The subcutaneous tissue was divided with electrocautery. I came across what appeared to be a hernia sac but it also contained a large amount of what appeared to be a lipoma attached to it. It went down to the fascial defect edges. I was able to excise this entire subcutaneous adipose tissue that appeared to be consistent with lipoma. Intact fascia was identified circumferentially around the defect. Skin and soft tissue was mobilized from the surface of the fascia in a circumferential manner. The defect measured approximately 3 cm by about 3.5 cm. It was a little bit more wide than vertical.    We selected a round 6 inch (15.2 cm) bard ventral light ST mesh.  We placed 6 stay sutures of 0 Novofil around the edges of the mesh.  The mesh was then rolled up and inserted through the fascial defect. The fascia was then closed transversely with 6 interrupted 0-novafil sutures. I returned laparoscopically and pneumoperitoneum was reestablished. There was no air leak. I infiltrated some Exparel around the fascial closure   The mesh was then unrolled.  The stay sutures were then pulled up through small stab incisions using the Endo-close device.  This deployed the mesh widely over the closed fascial defect.  The stay sutures were then tied down.  The Secure Strap device was then used to tack  down the edges of the mesh at 1 cm intervals circumferentially.  We placed a few tacks inside the outer ring of tacks. X Burrell was infiltrated around  the transvaginal suture sites and laterally on both sides in the preperitoneal space. We inspected for hemostasis.   Pneumoperitoneum was then released as we removed the remainder of the trocars.  The port sites were closed with 4-0 Monocryl.     The supraumbilical cavity was irrigated and additional local was infiltrated in the subcutaneous tissue and fascia.  Hemostasis was confirmed. The soft tissue was irrigated and closed in layers with inverted interrupted 3-0 vicryl sutures for the deep dermis. The skin incision was closed with a 4-0 monocryl subcuticular closure. All of the incisions and stay suture sites were then sealed with benzoin, Steri-Strips and sterile bandages..  An abdominal binder was placed around the patient's abdomen.  The patient was extubated and brought to the recovery room in stable condition.  All sponge, instrument, and needle counts were correct prior to closure and at the conclusion of the case.  Findings: Type of repair - primary suture with mesh underlay  (choices - primary suture, mesh, or component)  Name of mesh - bard ventral light ST  Size of mesh -round, 6 inch, 15.2 cm  Mesh overlap - more than 5 cm  Placement of mesh - beneath fascia and into peritoneal cavity  Estimated Blood Loss:  Minimal         Complications:  None; patient tolerated the procedure well.         Disposition: PACU - hemodynamically stable.         Condition: stable  Mary Sella. Andrey Campanile, MD, FACS General, Bariatric, & Minimally Invasive Surgery [facility], [place]

## 2016-06-13 NOTE — Anesthesia Procedure Notes (Signed)
Procedure Name: Intubation Date/Time: 06/13/2016 11:32 AM Performed by: Enriqueta ShutterWILLIFORD, Triston Skare D Pre-anesthesia Checklist: Patient identified, Emergency Drugs available, Suction available and Patient being monitored Patient Re-evaluated:Patient Re-evaluated prior to inductionOxygen Delivery Method: Circle system utilized Preoxygenation: Pre-oxygenation with 100% oxygen Intubation Type: IV induction Ventilation: Mask ventilation without difficulty Laryngoscope Size: Mac and 4 Tube type: Oral Tube size: 7.5 mm Number of attempts: 1 Airway Equipment and Method: Stylet Placement Confirmation: ETT inserted through vocal cords under direct vision,  positive ETCO2 and breath sounds checked- equal and bilateral Secured at: 22 cm Tube secured with: Tape Dental Injury: Teeth and Oropharynx as per pre-operative assessment

## 2016-06-13 NOTE — Transfer of Care (Signed)
Immediate Anesthesia Transfer of Care Note  Patient: Alexander Woodward  Procedure(s) Performed: Procedure(s): LAPAROSCOPIC ASSISTED INCARCERATED VENTRAL HERNIA REPAIR WITH MESH (N/A) INSERTION OF MESH (N/A)  Patient Location: PACU  Anesthesia Type:General  Level of Consciousness: awake, alert  and oriented  Airway & Oxygen Therapy: Patient Spontanous Breathing and Patient connected to face mask oxygen  Post-op Assessment: Report given to RN and Post -op Vital signs reviewed and stable  Post vital signs: Reviewed and stable  Last Vitals:  Vitals:   06/13/16 0949  BP: (!) 169/92  Pulse: 97  Resp: 16  Temp: 36.9 C    Last Pain:  Vitals:   06/13/16 0949  TempSrc: Oral      Patients Stated Pain Goal: 4 (06/13/16 1002)  Complications: No apparent anesthesia complications

## 2016-06-13 NOTE — Interval H&P Note (Signed)
History and Physical Interval Note:  06/13/2016 10:51 AM  Alexander Woodward  has presented today for surgery, with the diagnosis of Incarcerated ventral hernia  The various methods of treatment have been discussed with the patient and family. After consideration of risks, benefits and other options for treatment, the patient has consented to  Procedure(s): LAPAROSCOPIC ASSISTED INCARCERATED VENTRAL HERNIA REPAIR WITH MESH (N/A) INSERTION OF MESH (N/A) as a surgical intervention .  The patient's history has been reviewed, patient examined, no change in status, stable for surgery.  I have reviewed the patient's chart and labs.  Questions were answered to the patient's satisfaction.     Mary SellaEric M. Andrey CampanileWilson, MD, FACS General, Bariatric, & Minimally Invasive Surgery [facility], [place]    [facility]ILSON,Aleera Gilcrease M

## 2016-06-13 NOTE — Anesthesia Preprocedure Evaluation (Addendum)
Anesthesia Evaluation  Patient identified by MRN, date of birth, ID band Patient awake    Reviewed: Allergy & Precautions, H&P , NPO status , Patient's Chart, lab work & pertinent test results  History of Anesthesia Complications Negative for: history of anesthetic complications  Airway Mallampati: II  TM Distance: >3 FB Neck ROM: full    Dental no notable dental hx.    Pulmonary neg pulmonary ROS,    Pulmonary exam normal breath sounds clear to auscultation       Cardiovascular negative cardio ROS Normal cardiovascular exam Rhythm:regular Rate:Normal     Neuro/Psych negative neurological ROS     GI/Hepatic negative GI ROS, Neg liver ROS,   Endo/Other  negative endocrine ROS  Renal/GU negative Renal ROS     Musculoskeletal   Abdominal   Peds  Hematology negative hematology ROS (+)   Anesthesia Other Findings Mets >10, denies chest pain, syncope or cardiac issues since childhood  Reproductive/Obstetrics negative OB ROS                            Anesthesia Physical Anesthesia Plan  ASA: II  Anesthesia Plan: General   Post-op Pain Management:    Induction: Intravenous  Airway Management Planned: Oral ETT  Additional Equipment: None  Intra-op Plan:   Post-operative Plan: Extubation in OR  Informed Consent: I have reviewed the patients History and Physical, chart, labs and discussed the procedure including the risks, benefits and alternatives for the proposed anesthesia with the patient or authorized representative who has indicated his/her understanding and acceptance.   Dental Advisory Given  Plan Discussed with: Anesthesiologist, CRNA and Surgeon  Anesthesia Plan Comments:         Anesthesia Quick Evaluation

## 2016-06-14 ENCOUNTER — Encounter (HOSPITAL_COMMUNITY): Payer: Self-pay | Admitting: General Surgery

## 2016-06-14 LAB — CBC
HEMATOCRIT: 39.7 % (ref 39.0–52.0)
HEMOGLOBIN: 14 g/dL (ref 13.0–17.0)
MCH: 31.5 pg (ref 26.0–34.0)
MCHC: 35.3 g/dL (ref 30.0–36.0)
MCV: 89.2 fL (ref 78.0–100.0)
Platelets: 260 10*3/uL (ref 150–400)
RBC: 4.45 MIL/uL (ref 4.22–5.81)
RDW: 11.6 % (ref 11.5–15.5)
WBC: 11.4 10*3/uL — AB (ref 4.0–10.5)

## 2016-06-14 LAB — BASIC METABOLIC PANEL
ANION GAP: 6 (ref 5–15)
BUN: 11 mg/dL (ref 6–20)
CHLORIDE: 106 mmol/L (ref 101–111)
CO2: 27 mmol/L (ref 22–32)
Calcium: 8.8 mg/dL — ABNORMAL LOW (ref 8.9–10.3)
Creatinine, Ser: 1.08 mg/dL (ref 0.61–1.24)
GFR calc Af Amer: >60 mL/min (ref >60)
GFR calc non Af Amer: >60 mL/min (ref >60)
GLUCOSE: 127 mg/dL — AB (ref 65–99)
POTASSIUM: 4.1 mmol/L (ref 3.5–5.1)
Sodium: 139 mmol/L (ref 135–145)

## 2016-06-14 MED ORDER — OXYCODONE HCL 5 MG PO TABS: 5.0000 mg | ORAL_TABLET | ORAL | 0 refills | Status: AC | PRN

## 2016-06-14 NOTE — Discharge Summary (Signed)
Physician Discharge Summary  Alexander Woodward Daily WGN:[id number] DOB: July 01, 1981 DOA: 06/13/2016  PCP: No PCP Per Patient  Admit date: 06/13/2016 Discharge date: 06/14/2016  Recommendations for Outpatient Follow-up:   Follow-up Information    Atilano InaWILSON,Avry Monteleone M, MD On 07/10/2016.   Specialty:  General Surgery Why:  8:45 AM (pls arrive at 8:30 am) , For wound re-check Contact information: [address] [place] [address] [phone]          Discharge Diagnoses:  1. Supraumbilical ventral hernia  Surgical Procedure: laparoscopic assisted repair of supraumbilical ventral hernia with mesh   Discharge Condition: good Disposition: home  Diet recommendation: regular  Filed Weights   06/13/16 0939  Weight: 85.7 kg (189 lb)   Hospital Course:  He came in for planned procedure. Please see op note for further details. He was kept overnight for observation. Perioperative pain controlled with scheduled tylenol, toradol, and PRN opioids. He did vomit twice on POD 0 but by POD 1 he had tolerated 2 meals and reported flatus. He was ambulating without difficulty. He did not require any narcotics overnight. His vitals were stable. We discussed dc instructions and he was deemed stable for dc.   BP 136/76 (BP Location: Right Arm)   Pulse 86   Temp 98.1 F (36.7 C) (Oral)   Resp 16   Ht 5\' 6"  (1.676 m)   Wt 85.7 kg (189 lb)   SpO2 96%   BMI 30.51 kg/m   Gen: alert, NAD, non-toxic appearing Pupils: equal, no scleral icterus Pulm: Lungs clear to auscultation, symmetric chest rise CV: regular rate and rhythm Abd: soft, mild appropriate tender, nondistended. +binder. No cellulitis. No incisional hernia Ext: no edema, no calf tenderness Skin: no rash, no jaundice    Discharge Instructions  Discharge Instructions    Call MD for:    Complete by:  As directed   Temperature >101   Call MD for:  hives    Complete by:  As directed   Call MD for:  persistant dizziness or  light-headedness    Complete by:  As directed   Call MD for:  persistant nausea and vomiting    Complete by:  As directed   Call MD for:  redness, tenderness, or signs of infection (pain, swelling, redness, odor or green/yellow discharge around incision site)    Complete by:  As directed   Call MD for:  severe uncontrolled pain    Complete by:  As directed   Diet general    Complete by:  As directed   Discharge instructions    Complete by:  As directed   See [facility] discharge instructions   Increase activity slowly    Complete by:  As directed       Medication List    TAKE these medications   acetaminophen 500 MG tablet Commonly known as:  TYLENOL Take 1,000 mg by mouth every 6 (six) hours as needed (for pain.).   naproxen sodium 220 MG tablet Commonly known as:  ANAPROX Take 440 mg by mouth 2 (two) times daily as needed (for pain.).   oxyCODONE 5 MG immediate release tablet Commonly known as:  Oxy IR/ROXICODONE Take 1-2 tablets (5-10 mg total) by mouth every 4 (four) hours as needed for moderate pain.      Follow-up Information    Atilano InaWILSON,Myeesha Shane M, MD On 07/10/2016.   Specialty:  General Surgery Why:  8:45 AM (pls arrive at 8:30 am) , For wound re-check Contact information: [address] [place] [address] [phone]            The results of significant diagnostics from this hospitalization (including imaging, microbiology, ancillary and laboratory) are listed below for reference.    Significant Diagnostic Studies: No results found.  Microbiology: No results found for this or any previous visit (from the past 240 hour(s)).   Labs: Basic Metabolic Panel:  Recent Labs Lab 06/14/16 0450  NA 139  K 4.1  CL 106  CO2 27  GLUCOSE 127*  BUN 11  CREATININE 1.08  CALCIUM 8.8*   Liver Function Tests: No results for input(s): AST, ALT, ALKPHOS, BILITOT, PROT, ALBUMIN in the last 168 hours. No results for input(s): LIPASE, AMYLASE in the last 168  hours. No results for input(s): AMMONIA in the last 168 hours. CBC:  Recent Labs Lab 06/07/16 1220 06/14/16 0450  WBC 5.1 11.4*  HGB 15.2 14.0  HCT 44.2 39.7  MCV 89.8 89.2  PLT 267 260   Cardiac Enzymes: No results for input(s): CKTOTAL, CKMB, CKMBINDEX, TROPONINI in the last 168 hours. BNP: BNP (last 3 results) No results for input(s): BNP in the last 8760 hours.  ProBNP (last 3 results) No results for input(s): PROBNP in the last 8760 hours.  CBG: No results for input(s): GLUCAP in the last 168 hours.  Active Problems:   S/P repair of ventral hernia   Time coordinating discharge: 15 minutes  Signed:  Atilano Ina, MD [facility], [place] [phone] 06/14/2016, 8:11 AM

## 2016-06-14 NOTE — Discharge Instructions (Signed)
[facility] [place]  UMBILICAL OR INGUINAL HERNIA REPAIR: POST OP INSTRUCTIONS  Always review your discharge instruction sheet given to you by the facility where your surgery was performed. IF YOU HAVE DISABILITY OR FAMILY LEAVE FORMS, YOU MUST BRING THEM TO THE OFFICE FOR PROCESSING.   DO NOT GIVE THEM TO YOUR DOCTOR.  1. A  prescription for pain medication may be given to you upon discharge.  Take your pain medication as prescribed, if needed.  You may take acetaminophen (Tylenol) &/or naprosxyn as needed. 2. Take your usually prescribed medications unless otherwise directed. 3. If you need a refill on your pain medication, please contact your pharmacy.  They will contact our office to request authorization. Prescriptions will not be filled after 5 pm or on week-ends. 4. You should follow a light diet the first 24 hours after arrival home, such as soup and crackers, etc.  Be sure to include lots of fluids daily.  Resume your normal diet the day after surgery. 5. Most patients will experience some swelling and bruising around the umbilicus or in the groin and scrotum.  Ice packs and reclining will help.  Swelling and bruising can take several days to resolve.  6. It is common to experience some constipation if taking pain medication after surgery.  Increasing fluid intake and taking a stool softener (such as Colace) will usually help or prevent this problem from occurring.  A mild laxative (Milk of Magnesia or Miralax) should be taken according to package directions if there are no bowel movements after 48 hours. 7. Unless discharge instructions indicate otherwise, you may remove your bandages 48 hours after surgery, and you may shower at that time. REMOVE LEFT SIDED CLEAR BANDAGES ON Sunday. REMOVE CENTRAL CLEAR BANDAGE on MONDAY You  have steri-strips (small skin tapes) in place directly over the incision.  These strips should be left on the skin for 7-10 days.  If your surgeon used  skin glue on the incision, you may shower in 24 hours.  The glue will flake off over the next 2-3 weeks.  Any sutures or staples will be removed at the office during your follow-up visit. 8. ACTIVITIES:  You may resume regular (light) daily activities beginning the next day--such as daily self-care, walking, climbing stairs--gradually increasing activities as tolerated.  You may have sexual intercourse when it is comfortable.  Refrain from any heavy lifting or straining until approved by your doctor. a. You may drive when you are no longer taking prescription pain medication, you can comfortably wear a seatbelt, and you can safely maneuver your car and apply brakes. b. RETURN TO WORK:  9. You should see your doctor in the office for a follow-up appointment approximately 2-3 weeks after your surgery.  Make sure that you call for this appointment within a day or two after you arrive home to insure a convenient appointment time. 10. OTHER INSTRUCTIONS: CAN NOT LIFT, PUSH, OR PULL ANYTHING GREATER THAN 10 LBS FOR 6 WEEKS  11. WEAR ABDOMINAL BINDER DAILY   WHEN TO CALL YOUR DOCTOR: 1. Fever over 101.0 2. Inability to urinate 3. Nausea and/or vomiting 4. Extreme swelling or bruising 5. Continued bleeding from incision. 6. Increased pain, redness, or drainage from the incision  The clinic staff is available to answer your questions during regular business hours.  Please dont hesitate to call and ask to speak to one of the nurses for clinical concerns.  If you have a medical emergency, go to the nearest  emergency room or call 911.  A surgeon from [facility] is always on call at the hospital   [address] [place], [place], [place]  [address] ?  [address], [place], [place]   [address] [phone] ? [phone] ? FAX [phone] Web site: [email or web address]

## 2016-06-14 NOTE — Progress Notes (Signed)
Discharge instructions discussed with patient until no further questions ask. Patient able to verbalize when to call MD.  IV discontinued. Am assessment otherwise unchanged.

## 2017-01-18 IMAGING — CT CT ABD-PELV W/ CM
2 of 4 series · 15 of 46 positions shown, 17 images · IV contrast (Omni 300)
Comparison: None.

CLINICAL DATA: Mid abdominal pain.  Right-sided pain.

EXAM:
CT ABDOMEN AND PELVIS WITH CONTRAST
TECHNIQUE: Multidetector CT imaging of the abdomen and pelvis was performed
using the standard protocol following bolus administration of
intravenous contrast.
CONTRAST:  50mL OMNIPAQUE IOHEXOL 300 MG/ML  SOLN

[Series 2: a/p w/ 5mm · axial · 0.77mm/px · z∈[-934,-514]mm · 12 of 94 slices shown, 14 images]
[im 5/94  soft-tissue]
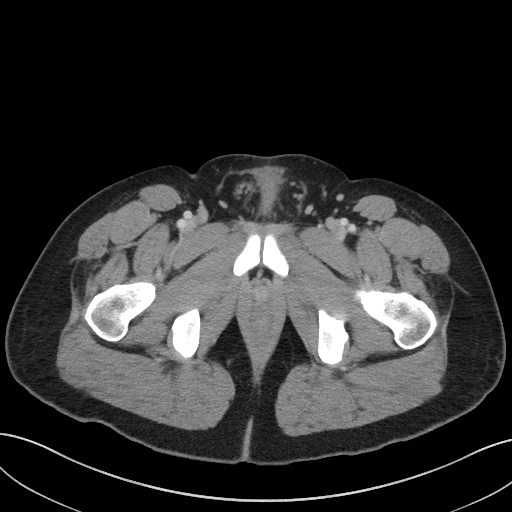
[im 5/94  bone]
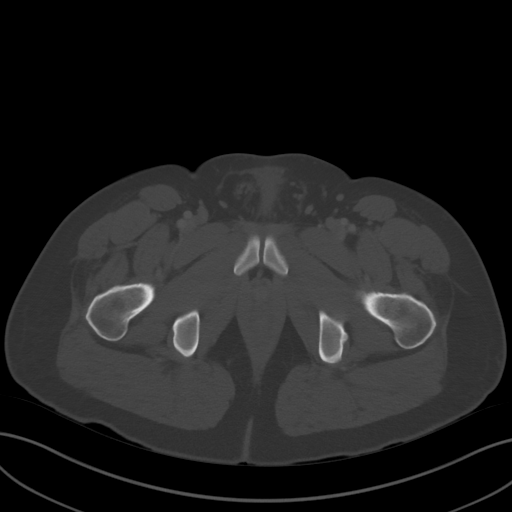
[im 13/94  soft-tissue]
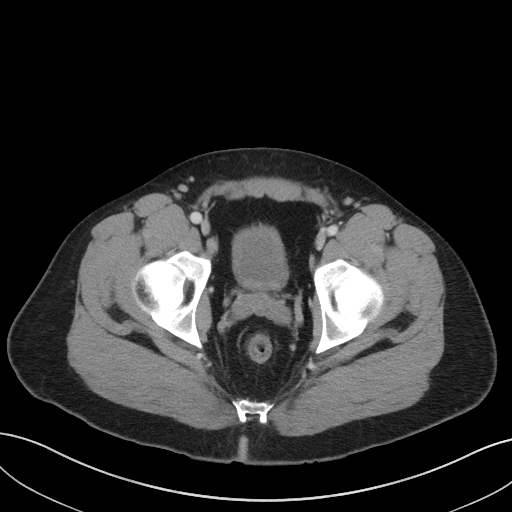
[im 22/94  soft-tissue]
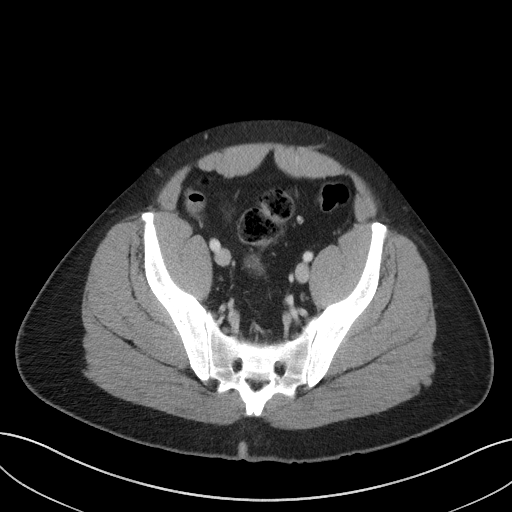
[im 30/94  soft-tissue]
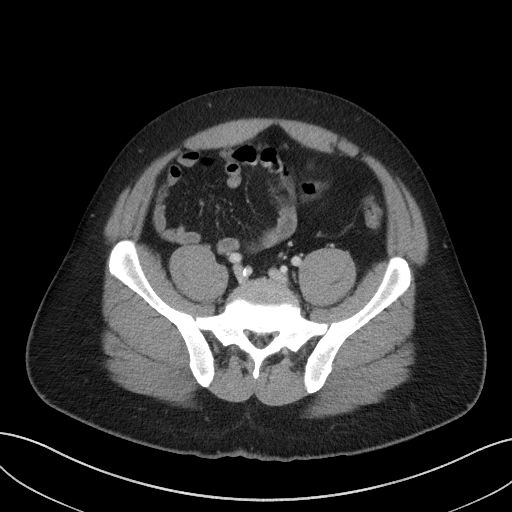
[im 34/94  soft-tissue]
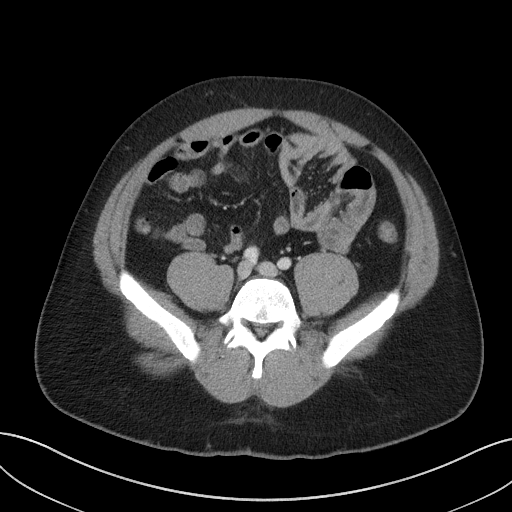
[im 43/94  soft-tissue]
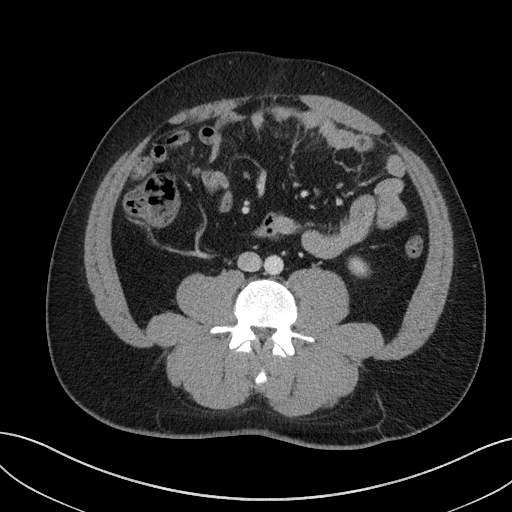
[im 51/94  soft-tissue]
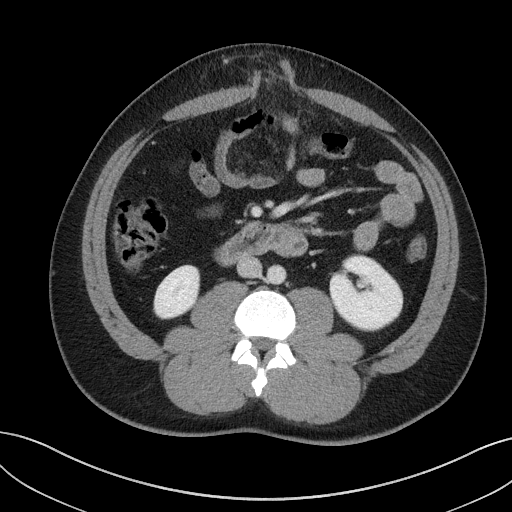
[im 60/94  soft-tissue]
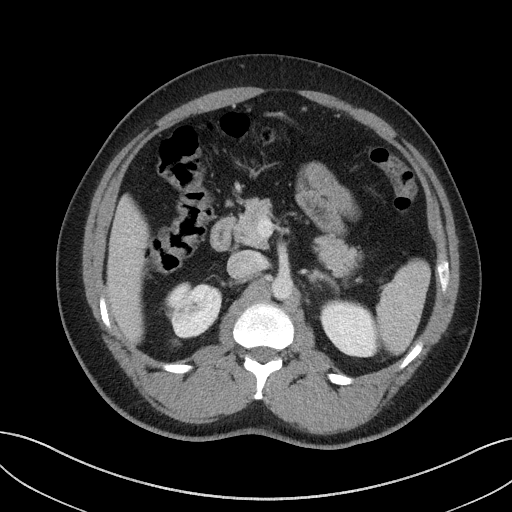
[im 64/94  soft-tissue]
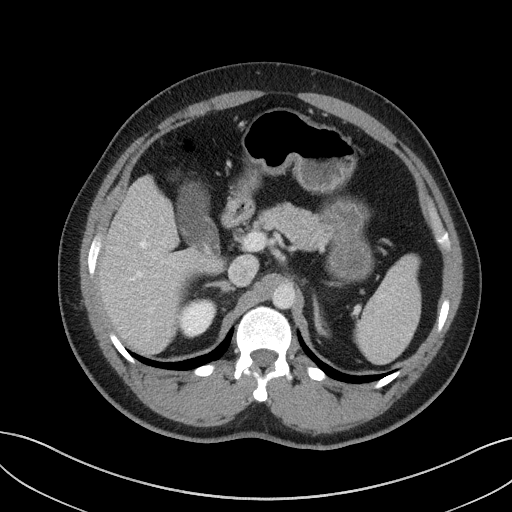
[im 64/94  bone]
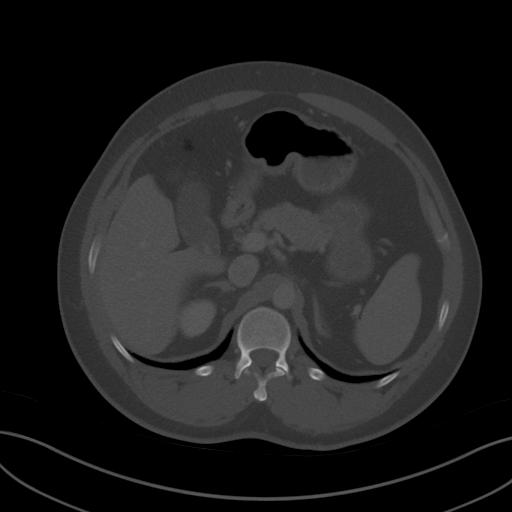
[im 72/94  soft-tissue]
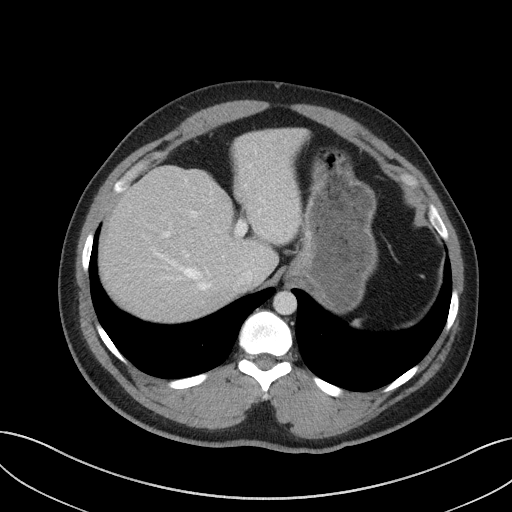
[im 81/94  soft-tissue]
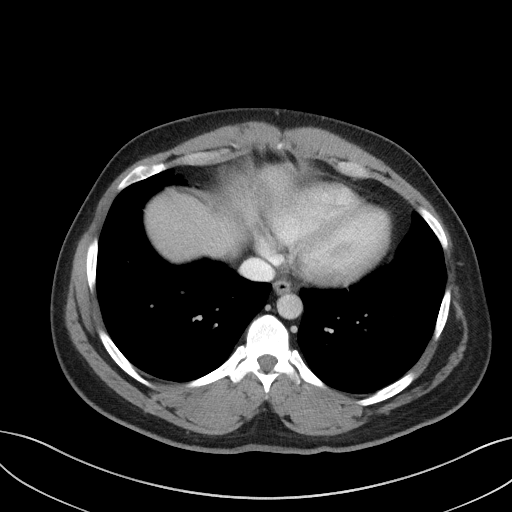
[im 89/94  soft-tissue]
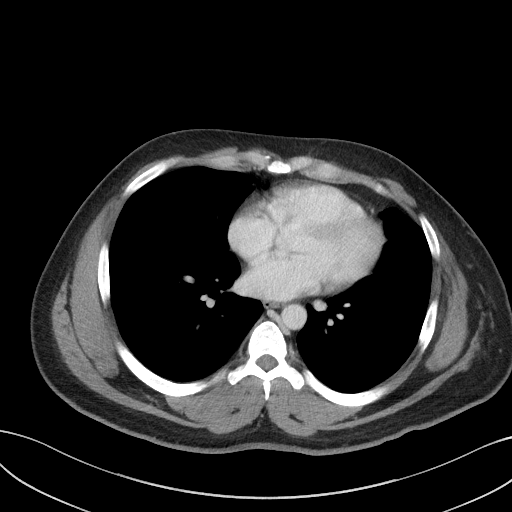

[Series 6: a/p w/ cor · coronal · 0.63mm/px · 3 of 153 slices shown]
[im 51/153  soft-tissue]
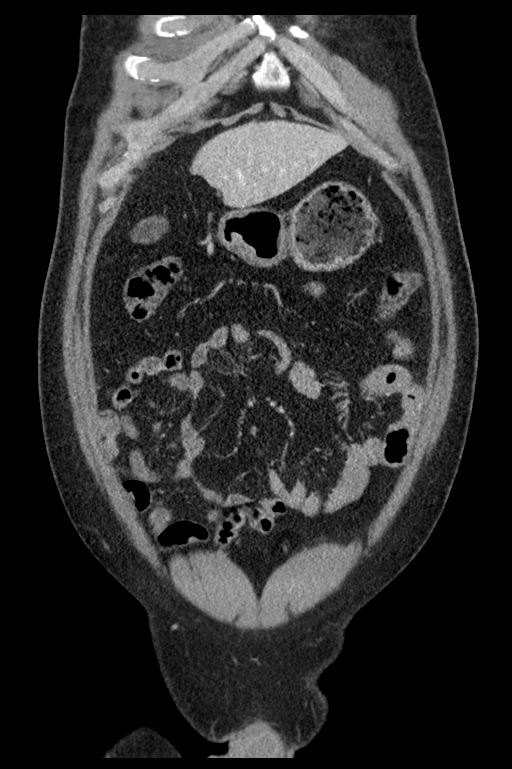
[im 68/153  soft-tissue]
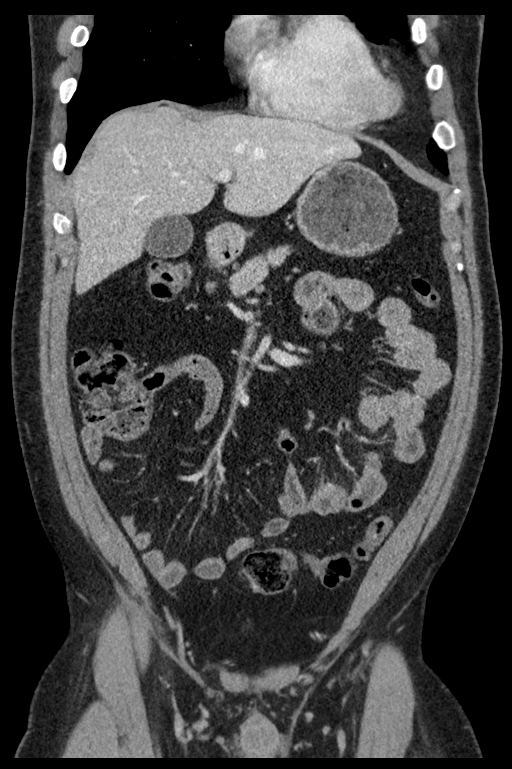
[im 85/153  soft-tissue]
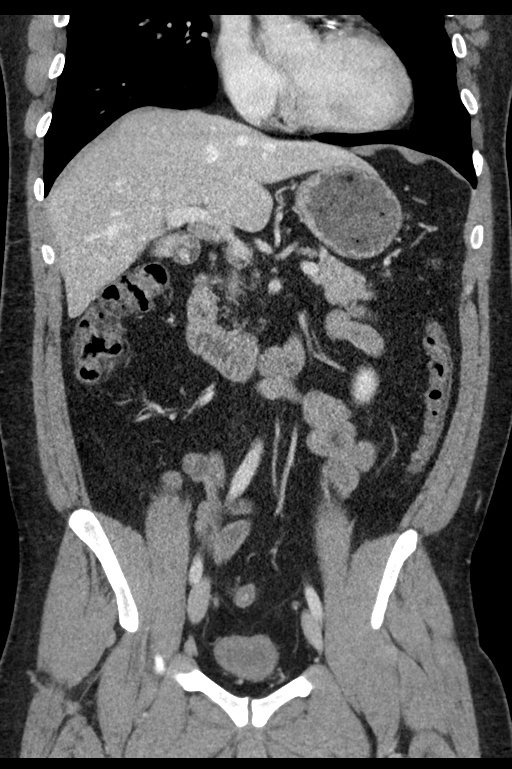

[15 of 46 positions shown; findings below may reference images not displayed]

FINDINGS: Lower chest: There is no focal lung opacity. No pleural effusion
identified.

Hepatobiliary: No suspicious liver abnormalities identified. Stones
noted within the gallbladder. This measures up to 1.3 cm. There is
no biliary dilatation.

Pancreas: Negative

Spleen: Negative

Adrenals/Urinary Tract: The adrenal glands are negative. Normal
appearance of the left kidney. Scar involves the upper pole of right
kidney. Urinary bladder is normal.

Stomach/Bowel: The stomach appears normal. The small bowel loops
have a normal caliber. There is no pathologic dilatation of the
small or large bowel loops. The appendix is visualized and appears
normal.

Vascular/Lymphatic: Normal appearance of the abdominal aorta. No
enlarged retroperitoneal or mesenteric adenopathy. No enlarged
pelvic or inguinal lymph nodes.

Reproductive: The prostate gland and seminal vesicles appear within
normal limits.

Other: There is no ascites or focal fluid collections within the
abdomen or pelvis. There is a supraumbilical hernia which contains
fat and a nonobstructed loop of small bowel. There is mild stranding
is around the hernia, image 44 of series 2. There is a small
calcific density associated with the hernia measure 6 mm, image 45
of series 2. There is also a small periumbilical hernia which
contains fat only.

Musculoskeletal: No aggressive lytic or sclerotic bone lesions
identified.
IMPRESSION: 1. There is a supraumbilical ventral abdominal wall hernia
containing fat and a nonobstructed loop of small bowel. There is
also a 6 mm calcified thick density within this area which is of on
certain significance. There is subtle stranding of the surrounding
fat. In a patient who is acutely symptomatic in this area early
incarceration cannot be excluded. Careful clinical correlation is
recommended.
2. Small fat containing umbilical hernia.
3. Gallstones.

## 2017-01-18 IMAGING — DX DG ABDOMEN ACUTE W/ 1V CHEST
3 series · 3 of 3 positions shown · non-contrast
Comparison: None.

CLINICAL DATA: Chronic abdominal pain

EXAM:
DG ABDOMEN ACUTE W/ 1V CHEST

[chest pa]
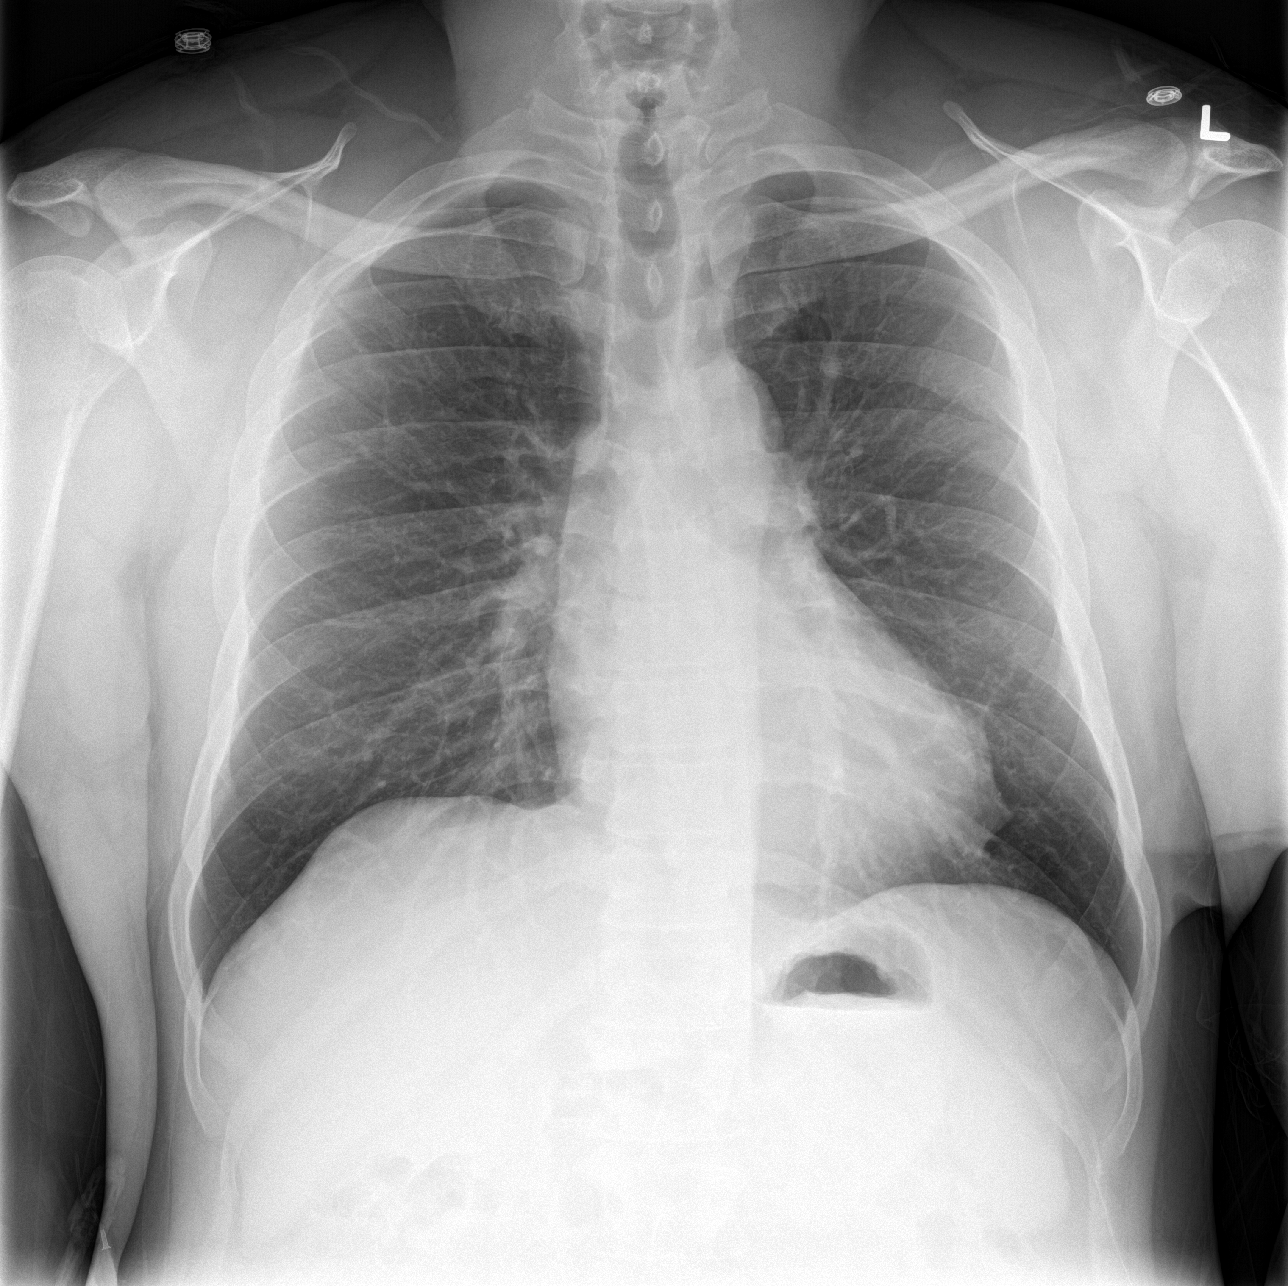

[abdomen erect]
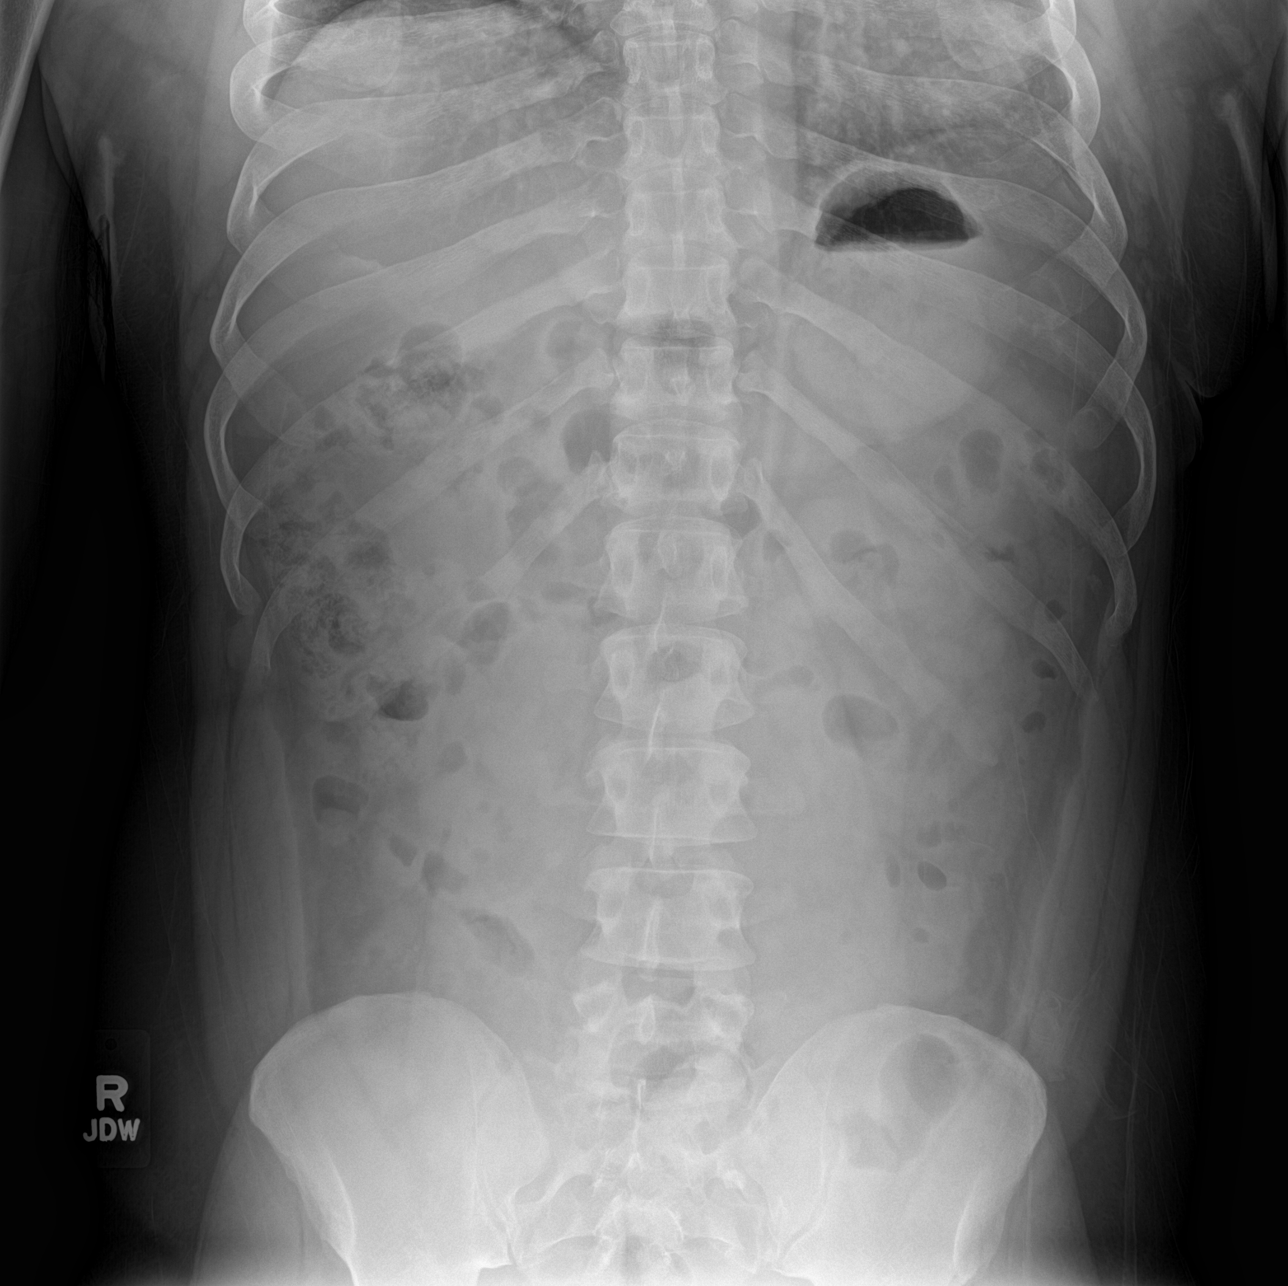

[abdomen supine]
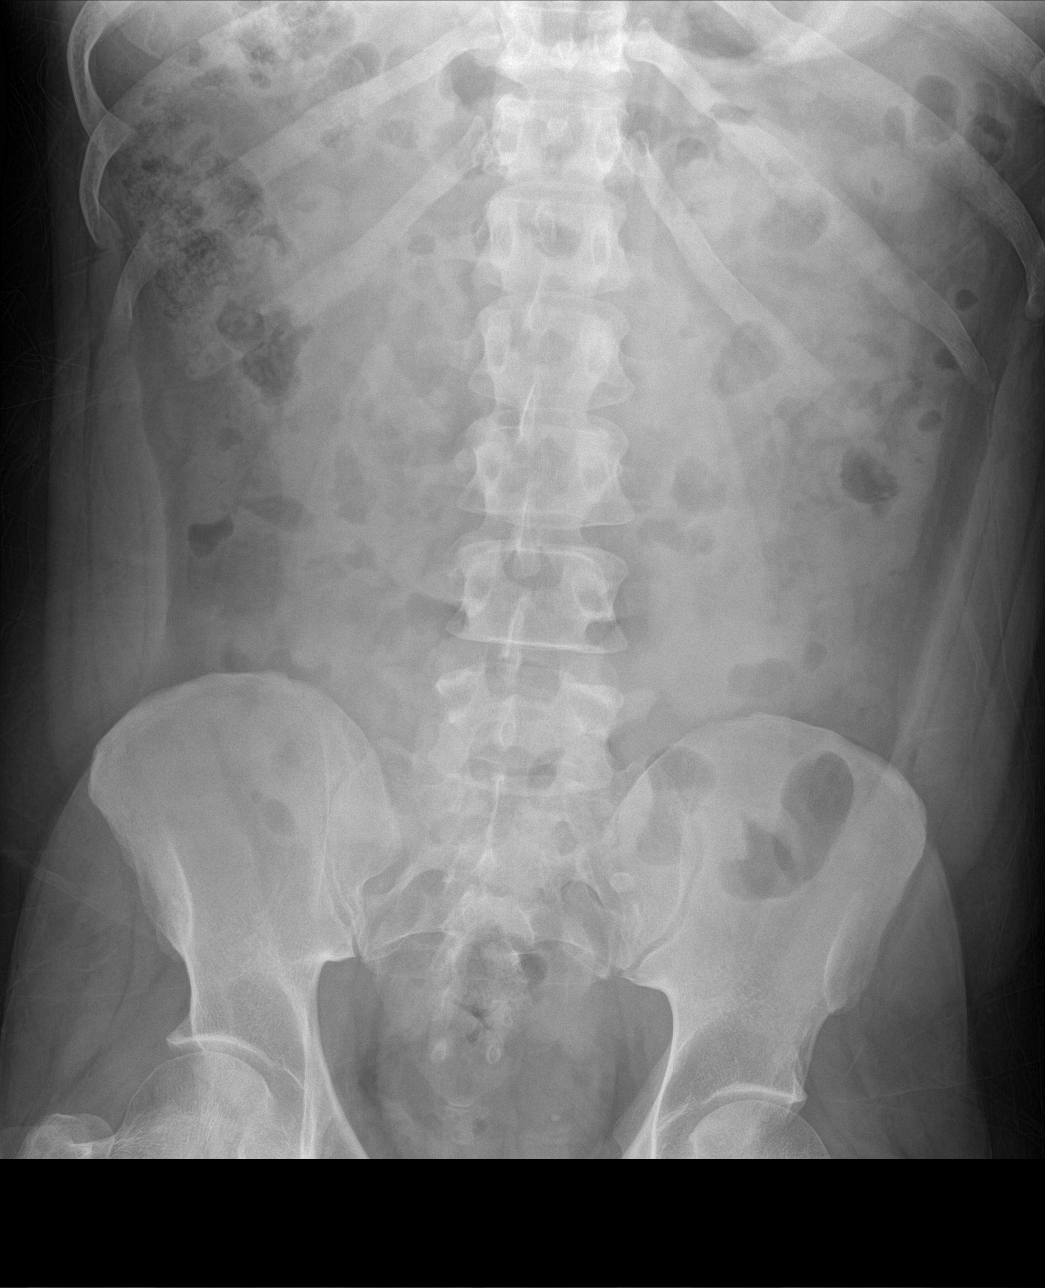

[3 of 3 positions shown; findings below may reference images not displayed]

FINDINGS: Normal heart size and vascularity. Lungs remain clear. No focal
pneumonia, collapse or consolidation. No edema, effusion or
pneumothorax. Trachea midline. No free air evident. Scattered air
and stool throughout the bowel. No significant dilatation,
obstruction or ileus pattern. No abnormal calcifications or osseous
abnormality.
IMPRESSION: No acute finding by plain radiography.

## 2019-03-20 ENCOUNTER — Other Ambulatory Visit: Payer: Self-pay

## 2019-03-20 ENCOUNTER — Other Ambulatory Visit: Payer: Self-pay | Admitting: *Deleted

## 2019-03-20 DIAGNOSIS — Z20822 Contact with and (suspected) exposure to covid-19: Secondary | ICD-10-CM

## 2019-03-22 LAB — NOVEL CORONAVIRUS, NAA: SARS-CoV-2, NAA: NOT DETECTED

## 2019-08-15 ENCOUNTER — Other Ambulatory Visit: Payer: Self-pay

## 2019-08-15 DIAGNOSIS — Z20822 Contact with and (suspected) exposure to covid-19: Secondary | ICD-10-CM

## 2019-08-16 LAB — NOVEL CORONAVIRUS, NAA: SARS-CoV-2, NAA: NOT DETECTED

## 2019-08-18 ENCOUNTER — Telehealth: Payer: Self-pay | Admitting: General Practice

## 2019-08-18 NOTE — Telephone Encounter (Signed)
Negative COVID results given. Patient results "NOT Detected." Caller expressed understanding. ° °

## 2019-10-26 ENCOUNTER — Ambulatory Visit: Payer: Self-pay | Attending: Internal Medicine

## 2019-10-26 DIAGNOSIS — Z20822 Contact with and (suspected) exposure to covid-19: Secondary | ICD-10-CM | POA: Insufficient documentation

## 2019-10-27 LAB — NOVEL CORONAVIRUS, NAA: SARS-CoV-2, NAA: NOT DETECTED

## 2019-10-28 ENCOUNTER — Telehealth: Payer: Self-pay | Admitting: *Deleted

## 2019-10-28 NOTE — Telephone Encounter (Signed)
Patient called ,given negative covid results .
